# Patient Record
Sex: Female | Born: 1970 | Race: Black or African American | Hispanic: No | Marital: Single | Smoking: Never smoker
Health system: Southern US, Community
[De-identification: ages and names within clinical notes are randomized; demographics above are authoritative.]

---

## 2012-08-13 ENCOUNTER — Other Ambulatory Visit (HOSPITAL_COMMUNITY): Payer: Self-pay | Admitting: Internal Medicine

## 2012-08-13 DIAGNOSIS — Z1231 Encounter for screening mammogram for malignant neoplasm of breast: Secondary | ICD-10-CM

## 2012-08-28 ENCOUNTER — Ambulatory Visit (HOSPITAL_COMMUNITY)
Admission: RE | Admit: 2012-08-28 | Discharge: 2012-08-28 | Disposition: A | Discharge status: Home / Self Care | Payer: Self-pay | Source: Ambulatory Visit | Attending: Internal Medicine | Admitting: Internal Medicine

## 2012-08-28 DIAGNOSIS — Z1231 Encounter for screening mammogram for malignant neoplasm of breast: Secondary | ICD-10-CM

## 2012-08-28 IMAGING — MG MM DIGITAL SCREENING BILAT
4 series · 4 of 4 positions shown · non-contrast
Comparison: 09/27/2010 and 02/19/2010 from [REDACTED]

***ADDENDUM*** CREATED: 09/03/2012 [DATE]
CLINICAL DATA: Screening.

DIGITAL BILATERAL SCREENING MAMMOGRAM WITH CAD
BILATERAL DIGITAL SCREENING MAMMOGRAM WITH CAD

[R CC]
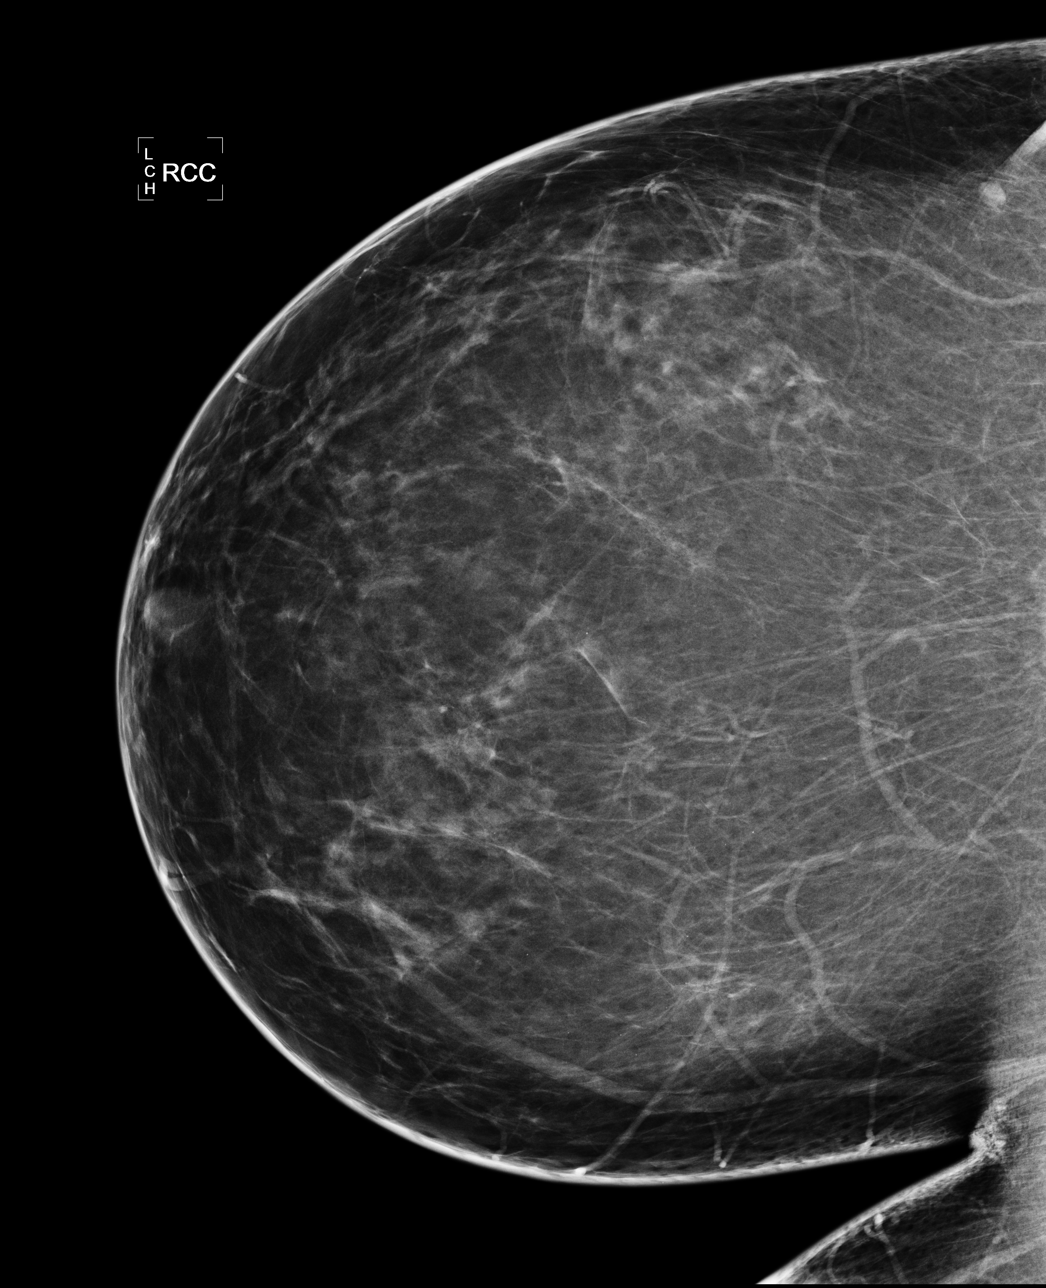

[R MLO]
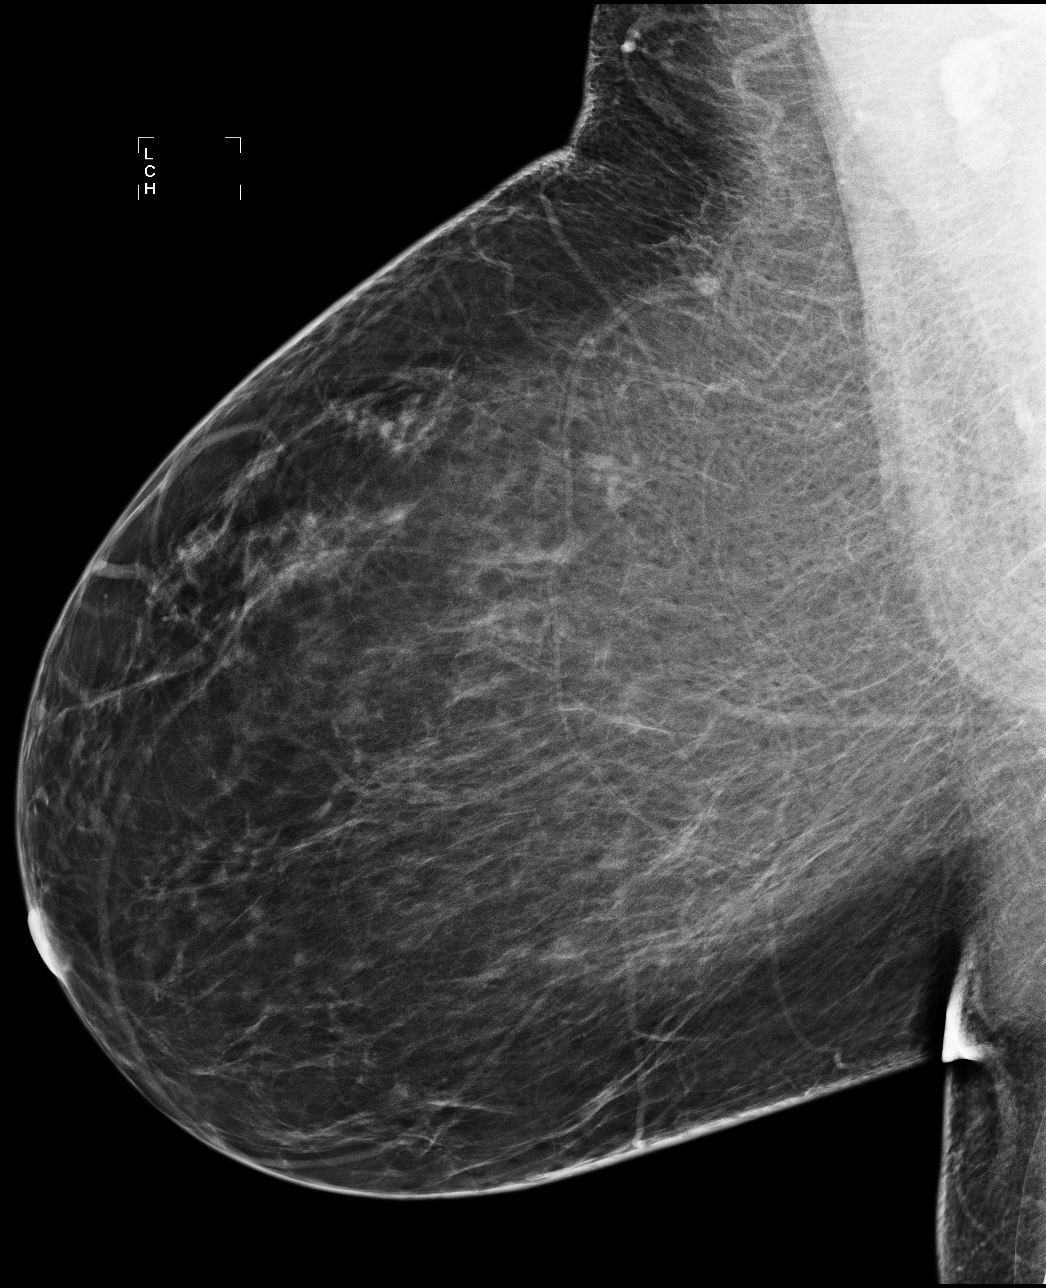

[L CC]
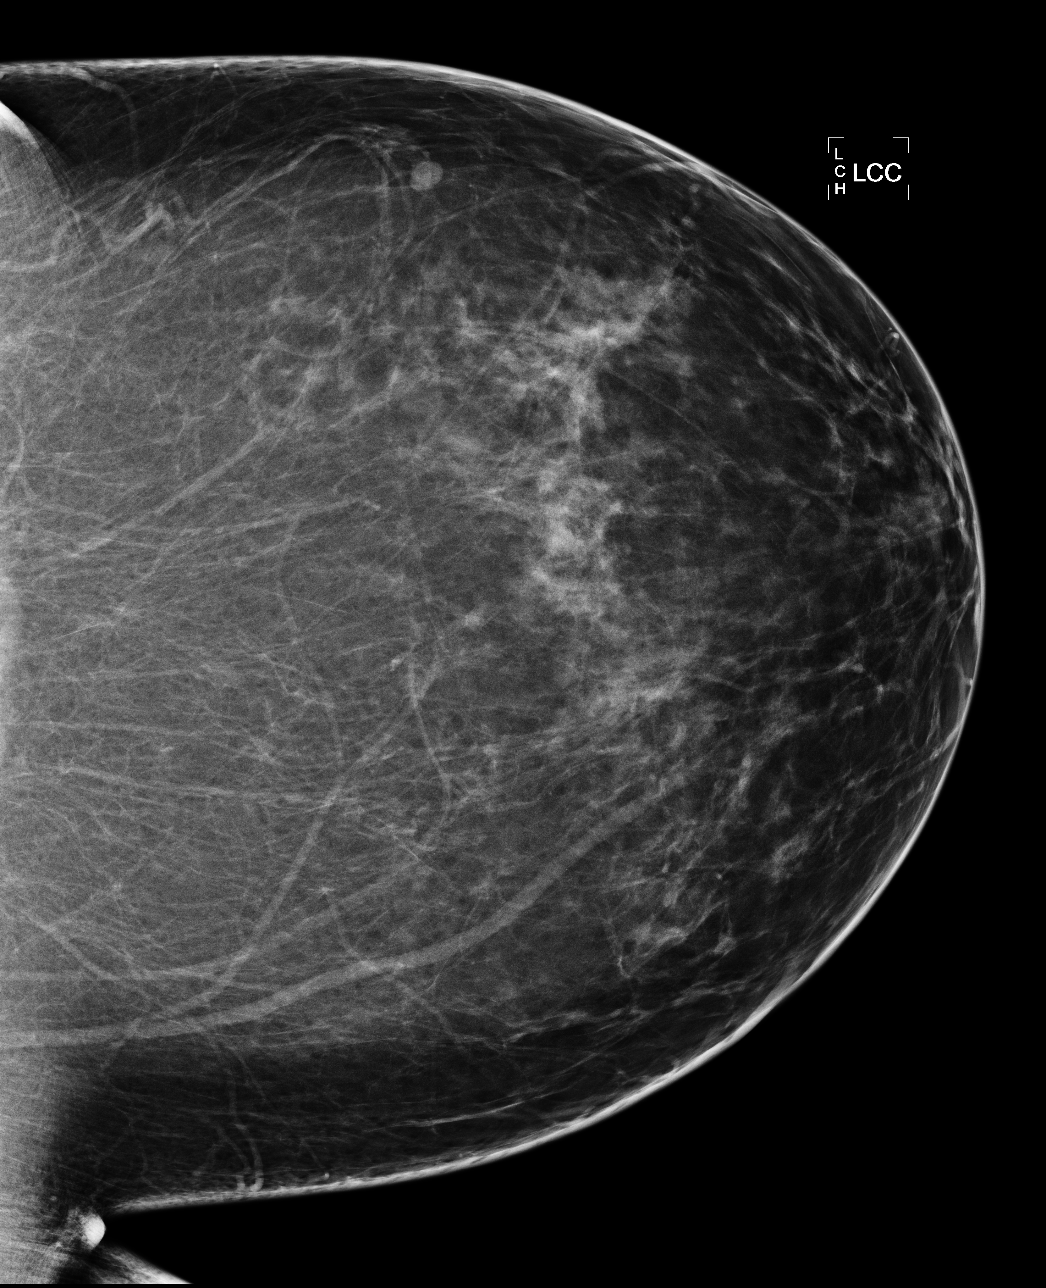

[L MLO]
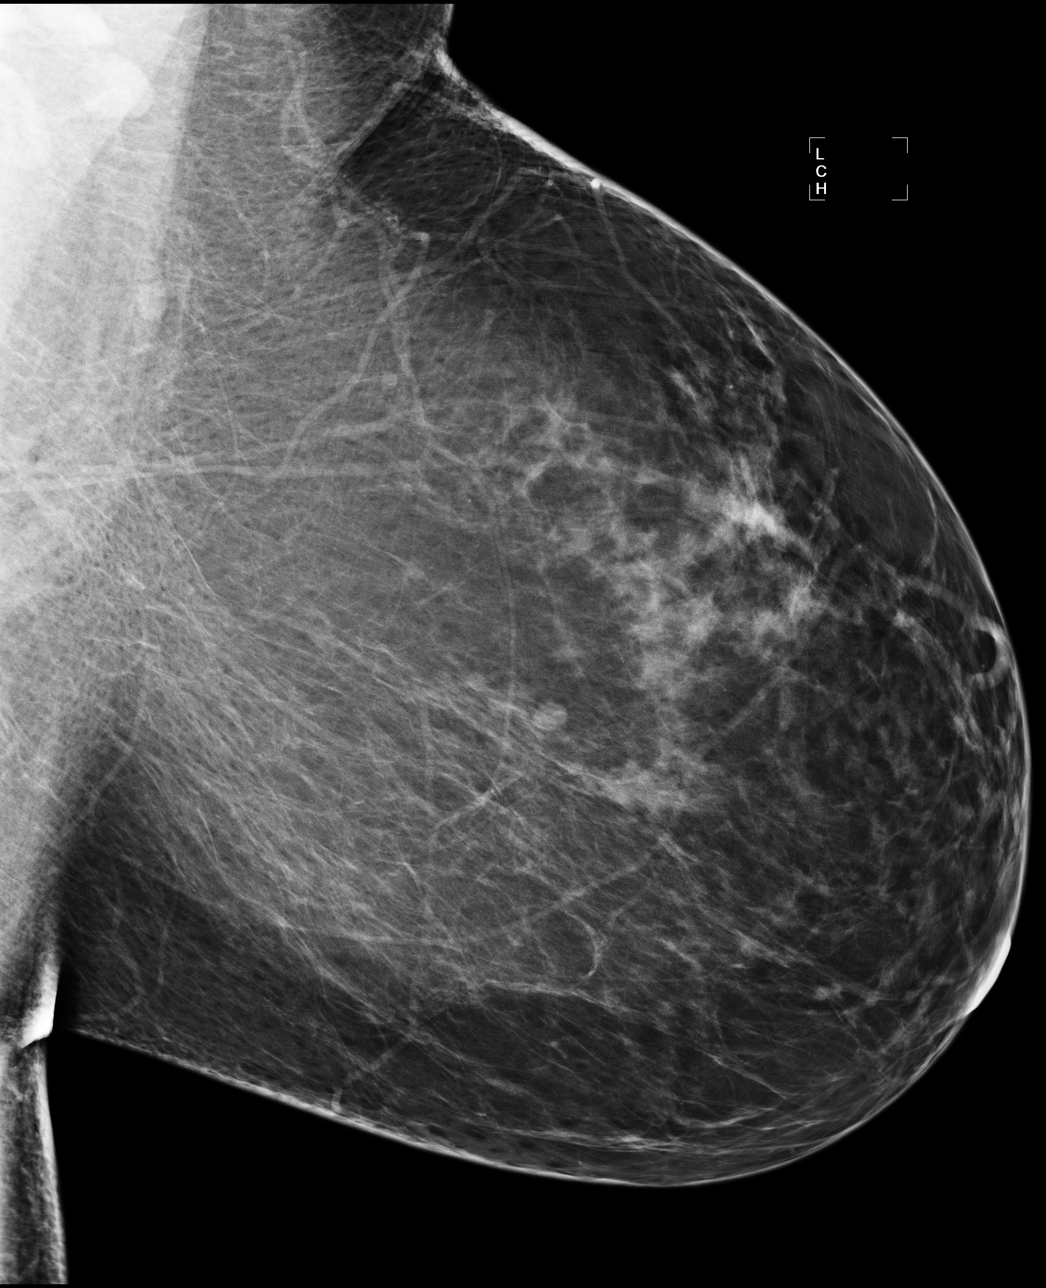

[4 of 4 positions shown; findings below may reference images not displayed]

FINDINGS: There are scattered fibroglandular densities.  There is
no suspicious dominant mass, architectural distortion or
calcification to suggest malignancy.

Images were processed with CAD.
IMPRESSION: No mammographic evidence of malignancy.

A result letter of this screening mammogram will be mailed directly
to the patient.

RECOMMENDATION:
Screening mammogram in one year. (Code:CA-C-41C)

BI-RADS CATEGORY 1:  Negative Addended by:  Myrolena Moradinajm, M.D.
on 09/03/2012 [DATE].

***END ADDENDUM*** SIGNED BY: Myrolena Moradinajm, M.D.
FINDINGS: Prior films are needed for interpretation.
IMPRESSION: Prior exams will be requested for comparison.  Following comparison
with prior studies an addendum will be made regarding further
recommendations.

Images were processed with CAD.

BI-RADS CATEGORY 0:  Incomplete.  Need additional imaging
evaluation and/or prior mammograms for comparison.

## 2022-08-11 ENCOUNTER — Telehealth (HOSPITAL_BASED_OUTPATIENT_CLINIC_OR_DEPARTMENT_OTHER): Payer: Self-pay | Admitting: Emergency Medicine

## 2022-08-11 ENCOUNTER — Emergency Department (HOSPITAL_BASED_OUTPATIENT_CLINIC_OR_DEPARTMENT_OTHER)
Admission: EM | Admit: 2022-08-11 | Discharge: 2022-08-11 | Disposition: A | Discharge status: Home / Self Care | Payer: Medicaid Other | Attending: Emergency Medicine | Admitting: Emergency Medicine

## 2022-08-11 ENCOUNTER — Other Ambulatory Visit: Payer: Self-pay

## 2022-08-11 ENCOUNTER — Encounter (HOSPITAL_BASED_OUTPATIENT_CLINIC_OR_DEPARTMENT_OTHER): Payer: Self-pay

## 2022-08-11 DIAGNOSIS — N3000 Acute cystitis without hematuria: Secondary | ICD-10-CM | POA: Diagnosis not present

## 2022-08-11 DIAGNOSIS — R300 Dysuria: Secondary | ICD-10-CM | POA: Diagnosis present

## 2022-08-11 LAB — URINALYSIS, ROUTINE W REFLEX MICROSCOPIC
Bilirubin Urine: NEGATIVE
Glucose, UA: NEGATIVE mg/dL
Ketones, ur: NEGATIVE mg/dL
Nitrite: NEGATIVE
Protein, ur: 100 mg/dL — AB
Specific Gravity, Urine: 1.025 (ref 1.005–1.030)
pH: 6 (ref 5.0–8.0)

## 2022-08-11 LAB — URINALYSIS, MICROSCOPIC (REFLEX)
RBC / HPF: >50 RBC/hpf (ref 0–5)
WBC, UA: >50 WBC/hpf (ref 0–5)

## 2022-08-11 MED ORDER — PHENAZOPYRIDINE HCL 200 MG PO TABS: 200.0000 mg | ORAL_TABLET | Freq: Three times a day (TID) | ORAL | 0 refills | Status: DC

## 2022-08-11 MED ORDER — NITROFURANTOIN MONOHYD MACRO 100 MG PO CAPS: 100.0000 mg | ORAL_CAPSULE | Freq: Two times a day (BID) | ORAL | 0 refills | Status: DC

## 2022-08-11 MED ORDER — PHENAZOPYRIDINE HCL 100 MG PO TABS
200.0000 mg | ORAL_TABLET | Freq: Once | ORAL | Status: AC
  Administered 2022-08-11: 200 mg via ORAL
  Filled 2022-08-11: qty 2

## 2022-08-11 MED ORDER — NITROFURANTOIN MONOHYD MACRO 100 MG PO CAPS: 100.0000 mg | ORAL_CAPSULE | Freq: Two times a day (BID) | ORAL | 0 refills | Status: AC

## 2022-08-11 MED ORDER — NITROFURANTOIN MONOHYD MACRO 100 MG PO CAPS
100.0000 mg | ORAL_CAPSULE | Freq: Once | ORAL | Status: AC
  Administered 2022-08-11: 100 mg via ORAL
  Filled 2022-08-11: qty 1

## 2022-08-11 MED ORDER — PHENAZOPYRIDINE HCL 200 MG PO TABS: 200.0000 mg | ORAL_TABLET | Freq: Three times a day (TID) | ORAL | 0 refills | Status: AC

## 2022-08-11 NOTE — Telephone Encounter (Signed)
Nursing staff received phone call that patient had received a prescription that was unsigned.  I have reviewed the patient's chart and sent her Pyridium and Macrobid as originally prescribed to her desired pharmacy.  No additional needs.

## 2022-08-11 NOTE — ED Provider Notes (Signed)
[facility] Provider Note   CSN: [id number] Arrival date & time: 08/11/22  0030     History  Chief Complaint  Patient presents with   Dysuria    Sara Morgan is a 51 y.o. female.  The history is provided by the patient.  Dysuria Pain quality:  Burning Pain severity:  Moderate Onset quality:  Gradual Duration:  5 days Timing:  Constant Progression:  Worsening Chronicity:  New Relieved by:  Nothing Worsened by:  Nothing Ineffective treatments:  None tried Urinary symptoms: frequent urination   Associated symptoms: no fever and no vomiting   Risk factors: not pregnant        Home Medications Prior to Admission medications   Medication Sig Start Date End Date Taking? Authorizing Provider  nitrofurantoin, macrocrystal-monohydrate, (MACROBID) 100 MG capsule Take 1 capsule (100 mg total) by mouth 2 (two) times daily. X 7 days 08/11/22  Yes Teron Blais, MD  phenazopyridine (PYRIDIUM) 200 MG tablet Take 1 tablet (200 mg total) by mouth 3 (three) times daily. 08/11/22  Yes Bynum Mccullars, MD      Allergies    Ampicillin    Review of Systems   Review of Systems  Constitutional:  Negative for fever.  HENT:  Negative for facial swelling.   Eyes:  Negative for redness.  Respiratory:  Negative for wheezing and stridor.   Gastrointestinal:  Negative for vomiting.  Genitourinary:  Positive for dysuria and frequency.  All other systems reviewed and are negative.   Physical Exam Updated Vital Signs BP (!) 150/94   Pulse 100   Temp 98 F (36.7 C) (Oral)   Resp 18   Ht 5\' 1"  (1.549 m)   Wt 103.4 kg   SpO2 100%   BMI 43.08 kg/m  Physical Exam Vitals and nursing note reviewed.  Constitutional:      General: She is not in acute distress.    Appearance: She is well-developed.  HENT:     Head: Normocephalic and atraumatic.  Eyes:     Pupils: Pupils are equal, round, and reactive to light.  Cardiovascular:     Rate and Rhythm:  Normal rate and regular rhythm.     Pulses: Normal pulses.     Heart sounds: Normal heart sounds.  Pulmonary:     Effort: Pulmonary effort is normal. No respiratory distress.     Breath sounds: Normal breath sounds.  Abdominal:     General: Bowel sounds are normal. There is no distension.     Palpations: Abdomen is soft.     Tenderness: There is no abdominal tenderness. There is no guarding or rebound.  Genitourinary:    Vagina: No vaginal discharge.  Musculoskeletal:        General: Normal range of motion.     Cervical back: Neck supple.  Skin:    General: Skin is warm and dry.     Capillary Refill: Capillary refill takes less than 2 seconds.     Findings: No erythema or rash.  Neurological:     General: No focal deficit present.     Deep Tendon Reflexes: Reflexes normal.  Psychiatric:        Mood and Affect: Mood normal.     ED Results / Procedures / Treatments   Labs (all labs ordered are listed, but only abnormal results are displayed) Labs Reviewed  URINALYSIS, ROUTINE W REFLEX MICROSCOPIC - Abnormal; Notable for the following components:      Result Value   APPearance TURBID (*)  Hgb urine dipstick LARGE (*)    Protein, ur 100 (*)    Leukocytes,Ua MODERATE (*)    All other components within normal limits  URINALYSIS, MICROSCOPIC (REFLEX) - Abnormal; Notable for the following components:   Bacteria, UA MANY (*)    All other components within normal limits  URINE CULTURE    EKG None  Radiology No results found.  Procedures Procedures    Medications Ordered in ED Medications  nitrofurantoin (macrocrystal-monohydrate) (MACROBID) capsule 100 mg (has no administration in time range)  phenazopyridine (PYRIDIUM) tablet 200 mg (has no administration in time range)    ED Course/ Medical Decision Making/ A&P                           Medical Decision Making Patient with 5 days of dysuria and frequency   Amount and/or Complexity of Data Reviewed External  Data Reviewed: notes.    Details: Previous notes reviewed  Labs: ordered.    Details: Urine is consistent with UTI.  Patient wants a culture sent and this was done.    Risk Prescription drug management. Risk Details: Exam and vitals are benign and reassuring.  No signs of sepsis.  Stable for discharge. Strict return.      Final Clinical Impression(s) / ED Diagnoses Final diagnoses:  Acute cystitis without hematuria   Return for intractable cough, coughing up blood, fevers > 100.4 unrelieved by medication, shortness of breath, intractable vomiting, chest pain, shortness of breath, weakness, numbness, changes in speech, facial asymmetry, abdominal pain, passing out, Inability to tolerate liquids or food, cough, altered mental status or any concerns. No signs of systemic illness or infection. The patient is nontoxic-appearing on exam and vital signs are within normal limits.  I have reviewed the triage vital signs and the nursing notes. Pertinent labs & imaging results that were available during my care of the patient were reviewed by me and considered in my medical decision making (see chart for details). After history, exam, and medical workup I feel the patient has been appropriately medically screened and is safe for discharge home. Pertinent diagnoses were discussed with the patient. Patient was given return precautions.  Rx / DC Orders ED Discharge Orders          Ordered    nitrofurantoin, macrocrystal-monohydrate, (MACROBID) 100 MG capsule  2 times daily        08/11/22 0511    phenazopyridine (PYRIDIUM) 200 MG tablet  3 times daily        08/11/22 0511              Sarabeth Benton, MD 08/11/22 [id number]

## 2022-08-11 NOTE — ED Triage Notes (Addendum)
Dysuria, hematuria, and frequency and suprapubic pain x 5 days.

## 2022-08-13 LAB — URINE CULTURE
Culture: 80000 — AB
Special Requests: NORMAL

## 2022-08-14 ENCOUNTER — Telehealth (HOSPITAL_BASED_OUTPATIENT_CLINIC_OR_DEPARTMENT_OTHER): Payer: Self-pay | Admitting: *Deleted

## 2022-08-14 NOTE — Telephone Encounter (Signed)
Post ED Visit - Positive Culture Follow-up  Culture report reviewed by antimicrobial stewardship pharmacist: [facility] Team []  Elenor Quinones, Pharm.D. []  Heide Guile, Pharm.D., BCPS AQ-ID []  Parks Neptune, Pharm.D., BCPS []  Alycia Rossetti, Pharm.D., BCPS []  [place], Pharm.D., BCPS, AAHIVP []  Legrand Como, Pharm.D., BCPS, AAHIVP []  Salome Arnt, PharmD, BCPS []  Johnnette Gourd, PharmD, BCPS []  Hughes Better, PharmD, BCPS []  Leeroy Cha, PharmD []  Laqueta Linden, PharmD, BCPS []  Albertina Parr, PharmD  [facility] []  Leodis Sias, PharmD []  Lindell Spar, PharmD []  Royetta Asal, PharmD []  Graylin Shiver, Rph []  Rema Fendt) Glennon Mac, PharmD []  Arlyn Dunning, PharmD []  Netta Cedars, PharmD []  Dia Sitter, PharmD []  Leone Haven, PharmD []  Gretta Arab, PharmD []  Theodis Shove, PharmD []  Peggyann Juba, PharmD []  Reuel Boom, PharmD   Positive urine culture Treated with Nitrofurantoin Monohyd Macro, organism sensitive to the same and no further patient follow-up is required at this time.   Esmeralda Arthur, Pharm D  Harlon Flor Talley 08/14/2022, 12:21 PM
